# Patient Record
Sex: Female | Born: 1990 | Race: White | Hispanic: No | Marital: Married | Smoking: Current every day smoker
Health system: Southern US, Community
[De-identification: ages and names within clinical notes are randomized; demographics above are authoritative.]

## PROBLEM LIST (undated history)

## (undated) DIAGNOSIS — O24419 Gestational diabetes mellitus in pregnancy, unspecified control: Secondary | ICD-10-CM

## (undated) HISTORY — DX: Gestational diabetes mellitus in pregnancy, unspecified control: O24.419

## (undated) HISTORY — PX: FOOT SURGERY: SHX648

## (undated) HISTORY — PX: EUSTACHIAN TUBE DILATION: SHX6770

---
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
## 2008-03-11 ENCOUNTER — Ambulatory Visit: Payer: Self-pay | Admitting: Family Medicine

## 2008-03-11 DIAGNOSIS — Z91038 Other insect allergy status: Secondary | ICD-10-CM | POA: Insufficient documentation

## 2008-03-11 DIAGNOSIS — H6690 Otitis media, unspecified, unspecified ear: Secondary | ICD-10-CM | POA: Insufficient documentation

## 2008-03-27 ENCOUNTER — Other Ambulatory Visit: Admission: RE | Admit: 2008-03-27 | Discharge: 2008-03-27 | Payer: Self-pay | Admitting: Family Medicine

## 2008-03-27 ENCOUNTER — Encounter: Payer: Self-pay | Admitting: Family Medicine

## 2008-03-27 ENCOUNTER — Ambulatory Visit: Payer: Self-pay | Admitting: Family Medicine

## 2008-03-27 DIAGNOSIS — R61 Generalized hyperhidrosis: Secondary | ICD-10-CM | POA: Insufficient documentation

## 2008-03-27 LAB — CONVERTED CEMR LAB: Beta hcg, urine, semiquantitative: NEGATIVE

## 2008-12-02 ENCOUNTER — Observation Stay: Payer: Self-pay

## 2008-12-17 ENCOUNTER — Observation Stay: Payer: Self-pay

## 2008-12-28 ENCOUNTER — Inpatient Hospital Stay: Payer: Self-pay

## 2017-01-16 ENCOUNTER — Emergency Department
Admission: EM | Admit: 2017-01-16 | Discharge: 2017-01-16 | Disposition: A | Discharge status: Home / Self Care | Payer: Self-pay | Attending: Emergency Medicine | Admitting: Emergency Medicine

## 2017-01-16 ENCOUNTER — Encounter: Payer: Self-pay | Admitting: Emergency Medicine

## 2017-01-16 ENCOUNTER — Emergency Department: Payer: Self-pay

## 2017-01-16 DIAGNOSIS — J069 Acute upper respiratory infection, unspecified: Secondary | ICD-10-CM | POA: Insufficient documentation

## 2017-01-16 DIAGNOSIS — F17210 Nicotine dependence, cigarettes, uncomplicated: Secondary | ICD-10-CM | POA: Insufficient documentation

## 2017-01-16 DIAGNOSIS — H6981 Other specified disorders of Eustachian tube, right ear: Secondary | ICD-10-CM | POA: Insufficient documentation

## 2017-01-16 MED ORDER — AZITHROMYCIN 500 MG PO TABS
500.0000 mg | ORAL_TABLET | Freq: Once | ORAL | Status: AC
  Administered 2017-01-16: 500 mg via ORAL
  Filled 2017-01-16: qty 1

## 2017-01-16 MED ORDER — PREDNISONE 10 MG (21) PO TBPK: ORAL_TABLET | ORAL | 0 refills | Status: DC

## 2017-01-16 MED ORDER — AZITHROMYCIN 250 MG PO TABS: ORAL_TABLET | ORAL | 0 refills | Status: DC

## 2017-01-16 MED ORDER — PREDNISONE 20 MG PO TABS
60.0000 mg | ORAL_TABLET | Freq: Once | ORAL | Status: AC
  Administered 2017-01-16: 60 mg via ORAL
  Filled 2017-01-16: qty 3

## 2017-01-16 NOTE — ED Provider Notes (Signed)
[facility] Emergency Department Provider Note  ____________________________________________  Time seen: Approximately 9:32 PM  I have reviewed the triage vital signs and the nursing notes.   HISTORY  Chief Complaint Otalgia    HPI Kimberly Randall is a 26 y.o. female that presents to the emergency department with a clogged feeling in her right ear for 1 day. Patient states that she has also been congested and had a productive cough with white sputum for one week. She smokes a half a pack of cigarettes per day. 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 No alleviating measures have been attempted. She denies fever, shortness of breath, chest pain, nausea, vomiting, abdominal pain.   History reviewed. No pertinent past medical history.  Patient Active Problem List   Diagnosis Date Noted  . HYPERHIDROSIS 03/27/2008  . OTITIS MEDIA, RIGHT 03/11/2008  . 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 IRREGULAR MENSES 11/01/2007  . HIP PAIN, BILATERAL 11/01/2007  . SPRAIN/STRAIN, ANKLE NOS 04/06/2007  . MONONUCLEOSIS 04/05/2007  . SYMPTOM, PAIN, ABDOMINAL, LEFT LOWER QUADRANT 12/01/2006    Past Surgical History:  Procedure Laterality Date  . CESAREAN SECTION    . EUSTACHIAN TUBE DILATION    . FOOT SURGERY      Prior to Admission medications   Medication Sig Start Date End Date Taking? Authorizing Provider  azithromycin (ZITHROMAX Z-PAK) 250 MG tablet Take 2 tablets (500 mg) on  Day 1,  followed by 1 tablet (250 mg) once daily on Days 2 through 5. 01/16/17   Enid DerryWagner, Tung Pustejovsky, PA-C  predniSONE (STERAPRED UNI-PAK 21 TAB) 10 MG (21) TBPK tablet Take 6 tablets on day 1, take 5 tablets on day 2, take 4 tablets on day 3, take 3 tablets on day 4, take 2 tablets on day 5, take 1 tablet on day 6 01/16/17   Enid DerryWagner, Jachob Mcclean, PA-C    Allergies Patient has no known allergies.  No family history on file.  Social History Social History   Substance Use Topics  . Smoking status: Current Every Day Smoker    Packs/day: 0.50  . Smokeless tobacco: Never Used  . Alcohol use No     Review of Systems  Constitutional: No fever/chills Eyes: No visual changes. No discharge. ENT: Positive for congestion and rhinorrhea. Cardiovascular: No chest pain. Respiratory: Positive for cough. No SOB. Gastrointestinal: No abdominal pain.  No nausea, no vomiting.  No diarrhea.  No constipation. Musculoskeletal: Negative for musculoskeletal pain. Skin: Negative for rash, abrasions, lacerations, ecchymosis. Neurological: Negative for headaches.   ____________________________________________   PHYSICAL EXAM:  VITAL SIGNS: ED Triage Vitals [01/16/17 2110]  Enc Vitals Group     BP 129/83     Pulse Rate 65     Resp 18     Temp 98.3 F (36.8 C)     Temp Source Oral     SpO2 100 %     Weight 130 lb (59 kg)     Height 5\' 8"  (1.727 m)     Head Circumference      Peak Flow      Pain Score 10     Pain Loc      Pain Edu?      Excl. in GC?      Constitutional: Alert and oriented. Well appearing and in no acute distress. Eyes: Conjunctivae are normal. PERRL. EOMI. No discharge. Head: Atraumatic. ENT: No frontal and maxillary sinus tenderness.      Ears: Tympanic  membranes pearly gray with good landmarks. No discharge.      Nose: Mild congestion/rhinnorhea.      Mouth/Throat: Mucous membranes are moist. Oropharynx non-erythematous.  Neck: No stridor.   Hematological/Lymphatic/Immunilogical: No cervical lymphadenopathy. Cardiovascular: Normal rate, regular rhythm.  Good peripheral circulation. Respiratory: Normal respiratory effort without tachypnea or retractions. Lungs CTAB. Good air entry to the bases with no decreased or absent breath sounds. Gastrointestinal: Bowel sounds 4 quadrants. Soft and nontender to palpation. No guarding or rigidity. No palpable masses. No distention. Musculoskeletal: Full range of motion to all  extremities. No gross deformities appreciated. Neurologic:  Normal speech and language. No gross focal neurologic deficits are appreciated.  Skin:  Skin is warm, dry and intact. No rash noted.   ____________________________________________   LABS (all labs ordered are listed, but only abnormal results are displayed)  Labs Reviewed - No data to display ____________________________________________  EKG   ____________________________________________  RADIOLOGY  Dg Chest 2 View  Result Date: 01/16/2017 CLINICAL DATA:  26 year old female with dry cough. EXAM: CHEST  2 VIEW COMPARISON:  None. FINDINGS: The heart size and mediastinal contours are within normal limits. Both lungs are clear. The visualized skeletal structures are unremarkable. IMPRESSION: No active cardiopulmonary disease. Electronically Signed   By: Elgie Collard M.D.   On: 01/16/2017 21:49    ____________________________________________    PROCEDURES  Procedure(s) performed:    Procedures    Medications  predniSONE (DELTASONE) tablet 60 mg (60 mg Oral Given 01/16/17 2220)  azithromycin (ZITHROMAX) tablet 500 mg (500 mg Oral Given 01/16/17 2219)     ____________________________________________   INITIAL IMPRESSION / ASSESSMENT AND PLAN / ED COURSE  Pertinent labs & imaging results that were available during my care of the patient were reviewed by me and considered in my medical decision making (see chart for details).  Review of the [place] CSRS was performed in accordance of the [facility] prior to dispensing any controlled drugs.     Patient's diagnosis is consistent with upper respiratory infection and dysfunction of eustachian tube. Vital signs and exam are reassuring. Chest x-ray negative for acute cardiopulmonary processes. Patient feels comfortable going home. Patient will be discharged home with prescriptions for azithromycin and prednisone. Patient is to follow up with PCP as needed or otherwise  directed. Patient is given ED precautions to return to the ED for any worsening or new symptoms.     ____________________________________________  FINAL CLINICAL IMPRESSION(S) / ED DIAGNOSES  Final diagnoses:  Upper respiratory tract infection, unspecified type  Dysfunction of right eustachian tube      NEW MEDICATIONS STARTED DURING THIS VISIT:  Discharge Medication List as of 01/16/2017 10:12 PM    START taking these medications   Details  azithromycin (ZITHROMAX Z-PAK) 250 MG tablet Take 2 tablets (500 mg) on  Day 1,  followed by 1 tablet (250 mg) once daily on Days 2 through 5., Print    predniSONE (STERAPRED UNI-PAK 21 TAB) 10 MG (21) TBPK tablet Take 6 tablets on day 1, take 5 tablets on day 2, take 4 tablets on day 3, take 3 tablets on day 4, take 2 tablets on day 5, take 1 tablet on day 6, Print            This chart was dictated using voice recognition software/Dragon. Despite best efforts to proofread, errors can occur which can change the meaning. Any change was purely unintentional.    Enid Derry, PA-C 01/16/17 2349    Pershing Proud Myra Rude, MD 01/17/17  0108  

## 2017-01-16 NOTE — ED Triage Notes (Signed)
Pt states that she "started getting the clogged ear feeling last night" in her right ear. Pt reports hx of perforated ear drum in same ear x 2. Pt is ambulatory to triage with NAD noted at this time.

## 2017-03-17 ENCOUNTER — Other Ambulatory Visit: Payer: Self-pay | Admitting: Internal Medicine

## 2017-03-17 DIAGNOSIS — M5489 Other dorsalgia: Secondary | ICD-10-CM

## 2017-03-23 ENCOUNTER — Ambulatory Visit
Admission: RE | Admit: 2017-03-23 | Discharge: 2017-03-23 | Disposition: A | Discharge status: Home / Self Care | Payer: TRICARE For Life (TFL) | Source: Ambulatory Visit | Attending: Internal Medicine | Admitting: Internal Medicine

## 2017-03-23 DIAGNOSIS — M5489 Other dorsalgia: Secondary | ICD-10-CM | POA: Insufficient documentation

## 2017-03-23 MED ORDER — GADOBENATE DIMEGLUMINE 529 MG/ML IV SOLN
15.0000 mL | Freq: Once | INTRAVENOUS | Status: AC | PRN
  Administered 2017-03-23: 12 mL via INTRAVENOUS

## 2017-05-10 ENCOUNTER — Encounter: Payer: Self-pay | Admitting: Student in an Organized Health Care Education/Training Program

## 2017-05-10 ENCOUNTER — Ambulatory Visit
Payer: TRICARE For Life (TFL) | Attending: Student in an Organized Health Care Education/Training Program | Admitting: Student in an Organized Health Care Education/Training Program

## 2017-05-10 VITALS — BP 111/78 | HR 70 | Temp 98.6°F | Resp 16 | Ht 67.0 in | Wt 142.0 lb

## 2017-05-10 DIAGNOSIS — Z79891 Long term (current) use of opiate analgesic: Secondary | ICD-10-CM | POA: Diagnosis not present

## 2017-05-10 DIAGNOSIS — F17210 Nicotine dependence, cigarettes, uncomplicated: Secondary | ICD-10-CM | POA: Diagnosis not present

## 2017-05-10 DIAGNOSIS — M545 Low back pain: Secondary | ICD-10-CM | POA: Insufficient documentation

## 2017-05-10 DIAGNOSIS — Z9889 Other specified postprocedural states: Secondary | ICD-10-CM | POA: Diagnosis not present

## 2017-05-10 DIAGNOSIS — N926 Irregular menstruation, unspecified: Secondary | ICD-10-CM | POA: Diagnosis not present

## 2017-05-10 DIAGNOSIS — G894 Chronic pain syndrome: Secondary | ICD-10-CM | POA: Insufficient documentation

## 2017-05-10 DIAGNOSIS — M25551 Pain in right hip: Secondary | ICD-10-CM | POA: Diagnosis not present

## 2017-05-10 DIAGNOSIS — Z91040 Latex allergy status: Secondary | ICD-10-CM | POA: Insufficient documentation

## 2017-05-10 DIAGNOSIS — Z8632 Personal history of gestational diabetes: Secondary | ICD-10-CM | POA: Insufficient documentation

## 2017-05-10 DIAGNOSIS — Z79899 Other long term (current) drug therapy: Secondary | ICD-10-CM | POA: Insufficient documentation

## 2017-05-10 DIAGNOSIS — R1032 Left lower quadrant pain: Secondary | ICD-10-CM | POA: Insufficient documentation

## 2017-05-10 DIAGNOSIS — M79672 Pain in left foot: Secondary | ICD-10-CM | POA: Diagnosis present

## 2017-05-10 DIAGNOSIS — M25552 Pain in left hip: Secondary | ICD-10-CM | POA: Diagnosis not present

## 2017-05-10 DIAGNOSIS — G5772 Causalgia of left lower limb: Secondary | ICD-10-CM | POA: Insufficient documentation

## 2017-05-10 MED ORDER — NONFORMULARY OR COMPOUNDED ITEM: 2 refills | Status: AC

## 2017-05-10 MED ORDER — TIZANIDINE HCL 4 MG PO TABS: 4.0000 mg | ORAL_TABLET | Freq: Two times a day (BID) | ORAL | 1 refills | Status: AC | PRN

## 2017-05-10 MED ORDER — PREGABALIN 25 MG PO CAPS: 25.0000 mg | ORAL_CAPSULE | Freq: Two times a day (BID) | ORAL | 1 refills | Status: AC

## 2017-05-10 NOTE — Patient Instructions (Addendum)
As we discussed, your left foot pain may be secondary to complex regional pain syndrome. Please be sure to read about this.  For your pain we will start off with the following -Lyrica 25 mg twice daily (we are trying this medication since gabapentin caused sedation) -Tizanidine 4 mg twice daily as needed for muscle spasms -compounded cream that she can apply to your left ankle. This must be taken to a compounding pharmacy   Complex Regional Pain Syndrome Complex regional pain syndrome (CRPS) is a nerve disorder that causes long-lasting (chronic) pain, usually in a hand, arm, leg, or foot. CRPS usually follows an injury or trauma, such as a fracture or sprain. There are two types of CRPS:  Type 1. This type occurs after an injury or trauma with no known damage to a nerve.  Type 2. This type occurs after injury or trauma damages a nerve.  There are three stages of the condition:  Stage 1. This stage, called the acute stage, may last for three months.  Stage 2. This stage, called the dystrophic stage, may last for three to 12 months.  Stage 3. This stage, called the atrophic stage, may start after one year.  CRPS ranges from mild to severe. For most people CRPS is mild and recovery happens over time. For others, CRPS lasts a very long time and is debilitating. What are the causes? The exact cause of CRPS is not known. What increases the risk? You may be at increased risk if:  You are a woman.  You are approximately 26 years of age.  You have any of the following: ? A family history of CRPS. ? An injury or surgery. ? An infection. ? Cancer. ? Neck problems. ? A stroke. ? A heart attack. ? Asthma.  What are the signs or symptoms? Signs and symptoms in the affected limb are different for each stage. Signs and symptoms of stage 1 include:  Burning pain.  A pins and needles sensation.  Extremely sensitive skin.  Swelling.  Joint stiffness.  Warmth and  redness.  Excessive sweating.  Hair and nail growth that is faster than normal.  Signs and symptoms of stage 2 include:  Spreading of pain to the whole limb.  Increased skin sensitivity.  Increased swelling and stiffness.  Coolness of the skin.  Blue discoloration of skin.  Loss of skin wrinkles.  Brittle fingernails.  Signs and symptoms of stage 3 include:  Pain that spreads to other areas of the body but becomes less severe.  More stiffness, leading to loss of motion.  Skin that is pale, dry, shiny, and tightly stretched.  How is this diagnosed? There is no test to diagnose CRPS. Your health care provider will make a diagnosis based on your signs and symptoms and a physical exam. The exam may include tests to rule out other possible causes of your symptoms. Sometimes imaging tests are done, such as an MRI or bone scan. These tests check for bone changes that might indicate CRPS. How is this treated? Early treatment may prevent CRPS from advancing past stage 1. There is no one treatment that works for everyone. Treatment options may include:  Medicines, such as: ? Nonsteroidal-anti-inflammatory drugs (NSAIDS). ? Steroids. ? Blood pressure drugs. ? Antidepressants. ? Anti-seizure drugs. ? Pain relievers.  Exercise.  Occupational and physical therapy.  Biofeedback.  Mental health counseling.  Numbing injections.  Spinal surgery to implant a spinal cord stimulator or a pain pump.  Follow these instructions at home:  Take medicines only as directed by your health care provider.  Follow an exercise program as directed by your health care provider.  Maintain a healthy weight.  Keep all follow-up visits as directed by your health care provider. This is important. Contact a health care provider if:  Your symptoms change.  Your symptoms get worse.  You develop anxiety or depression. This information is not intended to replace advice given to you by your  health care provider. Make sure you discuss any questions you have with your health care provider. Document Released: 07/09/2002 Document Revised: 12/25/2015 Document Reviewed: 04/15/2014 Elsevier Interactive Patient Education  [employer].

## 2017-05-10 NOTE — Progress Notes (Signed)
Patient's Name: Kimberly Randall  MRN: [id number]  Referring Provider: Marinda Elk, MD  DOB: [birth year]  PCP: Marinda Elk, MD  DOS: 05/10/2017  Note by: Gillis Santa, MD  Service setting: Ambulatory outpatient  Specialty: Interventional Pain Management  Location: [facility] (AMB) Pain Management Facility  Visit type: Initial Patient Evaluation  Patient type: New Patient   Primary Reason(s) for Visit: Encounter for initial evaluation of one or more chronic problems (new to examiner) potentially causing chronic pain, and posing a threat to normal musculoskeletal function. (Level of risk: High) CC: Foot Pain (left) and Back Pain (mid, lower)  HPI  Kimberly Randall is a 26 y.o. year old, female patient, who comes today to see [place] for the first time for an initial evaluation of her chronic pain. She has MONONUCLEOSIS; OTITIS MEDIA, RIGHT; IRREGULAR MENSES; HIP PAIN, BILATERAL; HYPERHIDROSIS; SYMPTOM, PAIN, ABDOMINAL, LEFT LOWER QUADRANT; SPRAIN/STRAIN, ANKLE NOS; and PERSONAL HISTORY OF ALLERGY TO INSECTS on her problem list. Today she comes in for evaluation of her Foot Pain (left) and Back Pain (mid, lower)  Pain Assessment: Location: Left Foot Radiating: ankle Onset: More than a month ago Duration: Chronic pain Quality: Aching, Constant, Sharp Severity: 6 /10 (self-reported pain score)  Note: Reported level is compatible with observation.                   When using our objective Pain Scale, levels between 6 and 10/10 are said to belong in an emergency room, as it progressively worsens from a 6/10, described as severely limiting, requiring emergency care not usually available at an outpatient pain management facility. At a 6/10 level, communication becomes difficult and requires great effort. Assistance to reach the emergency department may be required. Facial flushing and profuse sweating along with potentially dangerous increases in heart rate and blood pressure will be evident. Effect on ADL:  prevent working, standing, walking  Timing: Constant Modifying factors: rest  Onset and Duration: Gradual and Present longer than 3 months Cause of pain: Trauma Severity: Getting worse, NAS-11 at its worse: 7/10, NAS-11 at its best: 4/10, NAS-11 now: 6/10 and NAS-11 on the average: 6/10 Timing: Not influenced by the time of the day, During activity or exercise and After activity or exercise Aggravating Factors: Kneeling, Prolonged standing, Squatting, Walking, Walking uphill and Walking downhill Alleviating Factors: Cold packs, Lying down, Medications, Resting and Using a brace Associated Problems: Fatigue, Numbness, Spasms, Swelling and Pain that wakes patient up Quality of Pain: Burning, Constant, Nagging, Sharp, Shooting, Tender, Throbbing and Uncomfortable Previous Examinations or Tests: MRI scan and X-rays Previous Treatments: Narcotic medications, Physical Therapy and Trigger point injections  The patient comes into the clinics today for the first time for a chronic pain management evaluation.   26 year old female who presents with left ankle and left foot pain. Patient states that left foot pain started after a accident that she sustained in high school. Patient subsequently went on to have a total of 5 ankle surgeries which included placement, removal, revision of ankle hardware back in [place]. 5 surgeries were done over the course of 5 years. These were done in [place]. Last surgery was last January.  Patient has severe pain her left foot with increased sensitivity, cool to touch, discoloration at times, and edema at times as well. Patient states that the pain from her foot stops at the medial malleolus. Over time because she has been guarding her left foot, she has been compensatingon her right foot and right hip which  over the last 2-3 years has resulted in bilateral hip pain.  Today I took the time to provide the patient with information regarding my pain practice. The patient was  informed that my practice is divided into two sections: an interventional pain management section, as well as a completely separate and distinct medication management section. I explained that I have procedure days for my interventional therapies, and evaluation days for follow-ups and medication management. Because of the amount of documentation required during both, they are kept separated. This means that there is the possibility that she may be scheduled for a procedure on one day, and medication management the next. I have also informed her that because of staffing and facility limitations, I no longer take patients for medication management only. To illustrate the reasons for this, I gave the patient the example of surgeons, and how inappropriate it would be to refer a patient to his/her care, just to write for the post-surgical antibiotics on a surgery done by a different surgeon.   Because interventional pain management is my board-certified specialty, the patient was informed that joining my practice means that they are open to any and all interventional therapies. I made it clear that this does not mean that they will be forced to have any procedures done. What this means is that I believe interventional therapies to be essential part of the diagnosis and proper management of chronic pain conditions. Therefore, patients not interested in these interventional alternatives will be better served under the care of a different practitioner.  The patient was also made aware of my Comprehensive Pain Management Safety Guidelines where by joining my practice, they limit all of their nerve blocks and joint injections to those done by our practice, for as long as we are retained to manage their care.   Historic Controlled Substance Pharmacotherapy Review  PMP and historical list of controlled substances: Norco 7.5 mg, quantity 10, last fill 05/04/2017 Highest opioid analgesic regimen found: Roxicodone 30 mg,  quantity 100, last fill 01/14/2017 Most recent opioid analgesic: Norco as above Current opioid analgesics: Norco as above  Highest recorded MME/day: 225 mg/day MME/day: 13m/day Medications: The patient did not bring the medication(s) to the appointment, as requested in our "New Patient Package" Pharmacodynamics: Desired effects: Analgesia: The patient reports >50% benefit. Reported improvement in function: The patient reports medication allows her to accomplish basic ADLs. Clinically meaningful improvement in function (CMIF): Sustained CMIF goals met Perceived effectiveness: Described as relatively effective, allowing for increase in activities of daily living (ADL) Undesirable effects: Side-effects or Adverse reactions: None reported Historical Monitoring: The patient  reports that she does not use drugs. List of all UDS Test(s): No results found for: MDMA, COCAINSCRNUR, PCPSCRNUR, PCPQUANT, CANNABQUANT, THCU, ESt. [place] SouthList of all Serum Drug Screening Test(s):  No results found for: AMPHSCRSER, BARBSCRSER, BENZOSCRSER, COCAINSCRSER, PCPSCRSER, PCPQUANT, THCSCRSER, CANNABQUANT, OPIATESCRSER, OXYSCRSER, PROPOXSCRSER Historical Background Evaluation: [place] PMP: Six (6) year initial data search conducted.             PMP NARX Score Report:  Narcotic: 390 Sedative: 271 Stimulant: 0 [place] Department of public safety, offender search: (Editor, commissioningInformation) Non-contributory Risk Assessment Profile: Aberrant behavior: None observed or detected today Risk factors for fatal opioid overdose: None identified today PMP NARX Overdose Risk Score: 440 Fatal overdose hazard ratio (HR): Calculation deferred Non-fatal overdose hazard ratio (HR): Calculation deferred Risk of opioid abuse or dependence: 0.7-3.0% with doses ? 36 MME/day and 6.1-26% with doses ? 120 MME/day. Substance use disorder (SUD)  risk level: goal is to avoid opioid therapy. Opioid risk tool (ORT) (Total Score): 0     Opioid Risk Tool -  05/10/17 0928      Family History of Substance Abuse   Alcohol Negative   Illegal Drugs Negative   Rx Drugs Negative     Personal History of Substance Abuse   Alcohol Negative   Illegal Drugs Negative   Rx Drugs Negative     Age   Age between 37-45 years  No     Psychological Disease   Psychological Disease Negative   Depression Negative     Total Score   Opioid Risk Tool Scoring 0   Opioid Risk Interpretation Low Risk     ORT Scoring interpretation table:  Score <3 = Low Risk for SUD  Score between 4-7 = Moderate Risk for SUD  Score >8 = High Risk for Opioid Abuse     Pharmacologic Plan: Pending ordered tests and/or consults            Initial impression: Poor candidate for opioid analgesics.  Meds   Current Outpatient Prescriptions:  .  azithromycin (ZITHROMAX Z-PAK) 250 MG tablet, Take 2 tablets (500 mg) on  Day 1,  followed by 1 tablet (250 mg) once daily on Days 2 through 5., Disp: 6 each, Rfl: 0 .  ibuprofen (ADVIL,MOTRIN) 800 MG tablet, Take 800 mg by mouth every 8 (eight) hours as needed., Disp: , Rfl:  .  zolpidem (AMBIEN) 5 MG tablet, Take 5 mg by mouth at bedtime., Disp: , Rfl:  .  NONFORMULARY OR COMPOUNDED ITEM, 10% Ketamine/2% Cyclobenzaprine/6% Gabapentin Cream Sig: 1-2 ml to affected area 3-4 times/day. Dispense: 240 GM bottle, Disp: 1 each, Rfl: 2 .  predniSONE (STERAPRED UNI-PAK 21 TAB) 10 MG (21) TBPK tablet, Take 6 tablets on day 1, take 5 tablets on day 2, take 4 tablets on day 3, take 3 tablets on day 4, take 2 tablets on day 5, take 1 tablet on day 6 (Patient not taking: Reported on 05/10/2017), Disp: 21 tablet, Rfl: 0 .  pregabalin (LYRICA) 25 MG capsule, Take 1 capsule (25 mg total) by mouth 2 (two) times daily., Disp: 60 capsule, Rfl: 1 .  tiZANidine (ZANAFLEX) 4 MG tablet, Take 1 tablet (4 mg total) by mouth 2 (two) times daily as needed for muscle spasms., Disp: 60 tablet, Rfl: 1  Imaging Review   Knee-L DG 1-2 views:  Results for orders  placed in visit on 07/25/06  DG Knee 1-2 Views Left   Narrative   PRIOR REPORT IMPORTED FROM THE [employer] WORKFLOW SYSTEM   REASON FOR EXAM:    Injury  COMMENTS:   PROCEDURE:     DXR - DXR KNEE LEFT AP AND LATERAL  - Jul 25 2006  2:34AM   RESULT:     AP and lateral views of the LEFT knee show no fracture,  dislocation or other acute bony abnormality. The knee joint space is well  maintained. The patella is intact.   IMPRESSION:     No acute changes are identified.   Thank you for this opportunity to contribute to the care of your patient.        Complexity Note: Imaging results reviewed. Results shared with Ms. Barraco, using Layman's terms.                         ROS  Cardiovascular History: No reported cardiovascular signs or symptoms such as High  blood pressure, coronary artery disease, abnormal heart rate or rhythm, heart attack, blood thinner therapy or heart weakness and/or failure Pulmonary or Respiratory History: Smoking Neurological History: No reported neurological signs or symptoms such as seizures, abnormal skin sensations, urinary and/or fecal incontinence, being born with an abnormal open spine and/or a tethered spinal cord Review of Past Neurological Studies: No results found for this or any previous visit. Psychological-Psychiatric History: Difficulty sleeping and or falling asleep Gastrointestinal History: No reported gastrointestinal signs or symptoms such as vomiting or evacuating blood, reflux, heartburn, alternating episodes of diarrhea and constipation, inflamed or scarred liver, or pancreas or irrregular and/or infrequent bowel movements Genitourinary History: No reported renal or genitourinary signs or symptoms such as difficulty voiding or producing urine, peeing blood, non-functioning kidney, kidney stones, difficulty emptying the bladder, difficulty controlling the flow of urine, or chronic kidney disease Hematological History: Brusing easily Endocrine  History: No reported endocrine signs or symptoms such as high or low blood sugar, rapid heart rate due to high thyroid levels, obesity or weight gain due to slow thyroid or thyroid disease Rheumatologic History: No reported rheumatological signs and symptoms such as fatigue, joint pain, tenderness, swelling, redness, heat, stiffness, decreased range of motion, with or without associated rash Musculoskeletal History: Negative for myasthenia gravis, muscular dystrophy, multiple sclerosis or malignant hyperthermia Work History: Out of work due to pain  Allergies  Ms. Grinnell is allergic to latex.  Laboratory Chemistry  Inflammation Markers (CRP: Acute Phase) (ESR: Chronic Phase) No results found for: CRP, ESRSEDRATE               Renal Function Markers No results found for: BUN, CREATININE, GFRAA, GFRNONAA               Hepatic Function Markers No results found for: AST, ALT, ALBUMIN, ALKPHOS, HCVAB               Electrolytes No results found for: NA, K, CL, CALCIUM, MG               Neuropathy Markers No results found for: [id number]               Bone Pathology Markers No results found for: Hendricks Milo, VD125OH2TOT, [id number], PO2423NT6, 25OHVITD1, 25OHVITD2, 25OHVITD3, CALCIUM, TESTOFREE, TESTOSTERONE               Coagulation Parameters No results found for: INR, LABPROT, APTT, PLT               Cardiovascular Markers No results found for: BNP, HGB, HCT               Note: Lab results reviewed.  [place]  Drug: Ms. Ritacco  reports that she does not use drugs. Alcohol:  reports that she does not drink alcohol. Tobacco:  reports that she has been smoking.  She has been smoking about 0.50 packs per day. She has never used smokeless tobacco. Medical:  has a past medical history of Gestational diabetes and Vaginal delivery. Family: family history is not on file.  Past Surgical History:  Procedure Laterality Date  . CESAREAN SECTION    . EUSTACHIAN TUBE DILATION    . FOOT  SURGERY     Active Ambulatory Problems    Diagnosis Date Noted  . MONONUCLEOSIS 04/05/2007  . OTITIS MEDIA, RIGHT 03/11/2008  . IRREGULAR MENSES 11/01/2007  . HIP PAIN, BILATERAL 11/01/2007  . HYPERHIDROSIS 03/27/2008  . SYMPTOM, PAIN, ABDOMINAL, LEFT LOWER QUADRANT 12/01/2006  . SPRAIN/STRAIN, ANKLE NOS  04/06/2007  . PERSONAL HISTORY OF ALLERGY TO INSECTS 03/11/2008   Resolved Ambulatory Problems    Diagnosis Date Noted  . No Resolved Ambulatory Problems   Past Medical History:  Diagnosis Date  . Gestational diabetes   . Vaginal delivery    Constitutional Exam  General appearance: Well nourished, well developed, and well hydrated. In no apparent acute distress Vitals:   05/10/17 0917  BP: 111/78  Pulse: 70  Resp: 16  Temp: 98.6 F (37 C)  TempSrc: Oral  SpO2: 100%  Weight: 142 lb (64.4 kg)  Height: 5' 7"  (1.702 m)   BMI Assessment: Estimated body mass index is 22.24 kg/m as calculated from the following:   Height as of this encounter: 5' 7"  (1.702 m).   Weight as of this encounter: 142 lb (64.4 kg).  BMI interpretation table: BMI level Category Range association with higher incidence of chronic pain  <18 kg/m2 Underweight   18.5-24.9 kg/m2 Ideal body weight   25-29.9 kg/m2 Overweight Increased incidence by 20%  30-34.9 kg/m2 Obese (Class I) Increased incidence by 68%  35-39.9 kg/m2 Severe obesity (Class II) Increased incidence by 136%  >40 kg/m2 Extreme obesity (Class III) Increased incidence by 254%   BMI Readings from Last 4 Encounters:  05/10/17 22.24 kg/m  01/16/17 19.77 kg/m  12/01/06 22.40 kg/m (64 %, Z= 0.37)*   * Growth percentiles are based on CDC 2-20 Years data.   Wt Readings from Last 4 Encounters:  05/10/17 142 lb (64.4 kg)  01/16/17 130 lb (59 kg)  03/27/08 155 lb (70.3 kg) (86 %, Z= 1.06)*  03/11/08 152 lb (68.9 kg) (84 %, Z= 0.98)*   * Growth percentiles are based on CDC 2-20 Years data.  Psych/Mental status: Alert, oriented x 3  (person, place, & time)       Eyes: PERLA Respiratory: No evidence of acute respiratory distress  Cervical Spine Area Exam  Skin & Axial Inspection: No masses, redness, edema, swelling, or associated skin lesions Alignment: Symmetrical Functional ROM: Unrestricted ROM      Stability: No instability detected Muscle Tone/Strength: Functionally intact. No obvious neuro-muscular anomalies detected. Sensory (Neurological): Unimpaired Palpation: No palpable anomalies              Upper Extremity (UE) Exam    Side: Right upper extremity  Side: Left upper extremity  Skin & Extremity Inspection: Skin color, temperature, and hair growth are WNL. No peripheral edema or cyanosis. No masses, redness, swelling, asymmetry, or associated skin lesions. No contractures.  Skin & Extremity Inspection: Skin color, temperature, and hair growth are WNL. No peripheral edema or cyanosis. No masses, redness, swelling, asymmetry, or associated skin lesions. No contractures.  Functional ROM: Unrestricted ROM          Functional ROM: Unrestricted ROM          Muscle Tone/Strength: Functionally intact. No obvious neuro-muscular anomalies detected.  Muscle Tone/Strength: Functionally intact. No obvious neuro-muscular anomalies detected.  Sensory (Neurological): Unimpaired          Sensory (Neurological): Unimpaired          Palpation: No palpable anomalies              Palpation: No palpable anomalies              Specialized Test(s): Deferred         Specialized Test(s): Deferred          Thoracic Spine Area Exam  Skin & Axial Inspection: No masses, redness, or  swelling Alignment: Symmetrical Functional ROM: Unrestricted ROM Stability: No instability detected Muscle Tone/Strength: Functionally intact. No obvious neuro-muscular anomalies detected. Sensory (Neurological): Unimpaired Muscle strength & Tone: No palpable anomalies  Lumbar Spine Area Exam  Skin & Axial Inspection: No masses, redness, or  swelling Alignment: Symmetrical Functional ROM: Unrestricted ROM      Stability: No instability detected Muscle Tone/Strength: Functionally intact. No obvious neuro-muscular anomalies detected. Sensory (Neurological): Unimpaired Palpation: No palpable anomalies       Provocative Tests: Lumbar Hyperextension and rotation test: evaluation deferred today       Lumbar Lateral bending test: evaluation deferred today       Patrick's Maneuver: evaluation deferred today                    Gait & Posture Assessment  Ambulation: Unassisted Gait: Relatively normal for age and body habitus Posture: WNL   Lower Extremity Exam    Side: Right lower extremity  Side: Left lower extremity  Skin & Extremity Inspection: Skin color, temperature, and hair growth are WNL. No peripheral edema or cyanosis. No masses, redness, swelling, asymmetry, or associated skin lesions. No contractures.  Skin & Extremity Inspection: left ankle positive for discoloration. Cold to touch. No edema appreciated.  Functional ROM: Unrestricted ROM          Functional ROM: moderately limited plantar flexion dorsiflexion, severely limited eversion and inversion.          Muscle Tone/Strength: Functionally intact. No obvious neuro-muscular anomalies detected.  Muscle Tone/Strength: Guardingof left ankle  Sensory (Neurological): Unimpaired  Sensory (Neurological): Allodynia (Painful response to non-painful stimuli)  Palpation: No palpable anomalies  Palpation: Complains of area being tender to palpationaround ankle specifically medial malleolus.   Assessment  Primary Diagnosis & Pertinent Problem List: The primary encounter diagnosis was Complex regional pain syndrome type 2 of left lower extremity. Diagnoses of Chronic pain syndrome, Pain in joint involving left pelvic region and thigh, and History of ankle surgery were also pertinent to this visit.  Visit Diagnosis (New problems to examiner): 1. Complex regional pain syndrome type 2  of left lower extremity   2. Chronic pain syndrome   3. Pain in joint involving left pelvic region and thigh   4. History of ankle surgery    Plan of Care (Initial workup plan)  Note: Please be advised that as per protocol, today's visit has been an evaluation only. We have not taken over the patient's controlled substance management.  25 year old female who presents with left ankle pain status post 6 ankle surgeries after she sustained a accident in high school. Patient recently moved from [place]. Was under the care of her podiatrist in [place]. On clinical evaluation and exam, patient's left ankle and foot pain could be secondary to CRPS type 2. According to the [place] criteria for CRPS atient does have allodynia and hyperesthesia, has skin color changes and temperature discrepancy between left foot and right foot, swelling at times, and changes in hair growth along her left foot. For the patient's CRPS type II we discussed starting off with conservative measures such as neuropathic agents and muscle relaxants along with compounded topical cream with ketamine that could help with her sympathetically mediated pain.  Given the patient's age and ineffectiveness of opioid therapy in the past, my preference is to avoid opioid therapy in this patient until last resort. Her treatment plan includes titration of neuropathic agents as well as topical therapy with compounded creams. I will see the patient one  month. If she is not obtaining benefit, we discussed performing left lumbar sympathetic nerve block for CRPS of left lower extremity.  Plan: -start Lyrica 25 mg twice a day (gabapentin caused sedation in the past and patient did not tolerate) -Tizanidine 4 mg twice a day when necessary for back and leg spasms. -prescription for compounded cream that includes ketamine, Flexeril, gabapentin -Follow up in 1 month, we'll further discuss left lumbar L3 sympathetic nerve block at that  time.    Pharmacotherapy (current): Medications ordered:  Meds ordered this encounter  Medications  . pregabalin (LYRICA) 25 MG capsule    Sig: Take 1 capsule (25 mg total) by mouth 2 (two) times daily.    Dispense:  60 capsule    Refill:  1    Patient has tried Gabapentin and resulted in sedation and nausea  . tiZANidine (ZANAFLEX) 4 MG tablet    Sig: Take 1 tablet (4 mg total) by mouth 2 (two) times daily as needed for muscle spasms.    Dispense:  60 tablet    Refill:  1  . NONFORMULARY OR COMPOUNDED ITEM    Sig: 10% Ketamine/2% Cyclobenzaprine/6% Gabapentin Cream Sig: 1-2 ml to affected area 3-4 times/day. Dispense: 240 GM bottle    Dispense:  1 each    Refill:  2    Do not place this medication, or any other prescription from our practice, on "Automatic Refill". Patient may have prescription filled one day early if pharmacy is closed on scheduled refill date.   Medications administered during this visit: Ms. Vinas had no medications administered during this visit.   Pharmacological management options:  Opioid Analgesics: we'll avoid opioid therapy. If all other measures fail, patient will need pain psych referral prior to any consideration of opioid therapy.  Membrane stabilizer: gabapentin: Sedation did not tolerate. We'll trial Lyrica today. Consider Topamax in the future.  Muscle relaxant: tizanidine today  NSAID: To be determined at a later time  Other analgesic(s): To be determined at a later time   Interventional management options: Ms. Saar was informed that there is no guarantee that she would be a candidate for interventional therapies. The decision will be based on the results of diagnostic studies, as well as Ms. Wildermuth risk profile.  Procedure(s) under consideration:  -left lumbar sympathetic nerve block (superior and anterolateral border of left L2 vertebrae superior to L2 TP)   Provider-requested follow-up: No Follow-up on file.  Future Appointments Date  Time Provider [place]  06/14/2017 8:45 AM Gillis Santa, MD [facility] None    Primary Care Physician: Marinda Elk, MD Location: [facility] Note by: Gillis Santa, M.D, Date: 05/10/2017; Time: 1:51 PM  Patient Instructions  As we discussed, your left foot pain may be secondary to complex regional pain syndrome. Please be sure to read about this.  For your pain we will start off with the following -Lyrica 25 mg twice daily (we are trying this medication since gabapentin caused sedation) -Tizanidine 4 mg twice daily as needed for muscle spasms -compounded cream that she can apply to your left ankle. This must be taken to a compounding pharmacy   Complex Regional Pain Syndrome Complex regional pain syndrome (CRPS) is a nerve disorder that causes long-lasting (chronic) pain, usually in a hand, arm, leg, or foot. CRPS usually follows an injury or trauma, such as a fracture or sprain. There are two types of CRPS:  Type 1. This type occurs after an injury or trauma with no  known damage to a nerve.  Type 2. This type occurs after injury or trauma damages a nerve.  There are three stages of the condition:  Stage 1. This stage, called the acute stage, may last for three months.  Stage 2. This stage, called the dystrophic stage, may last for three to 12 months.  Stage 3. This stage, called the atrophic stage, may start after one year.  CRPS ranges from mild to severe. For most people CRPS is mild and recovery happens over time. For others, CRPS lasts a very long time and is debilitating. What are the causes? The exact cause of CRPS is not known. What increases the risk? You may be at increased risk if:  You are a woman.  You are approximately 28 years of age.  You have any of the following: ? A family history of CRPS. ? An injury or surgery. ? An infection. ? Cancer. ? Neck problems. ? A stroke. ? A heart attack. ? Asthma.  What  are the signs or symptoms? Signs and symptoms in the affected limb are different for each stage. Signs and symptoms of stage 1 include:  Burning pain.  A pins and needles sensation.  Extremely sensitive skin.  Swelling.  Joint stiffness.  Warmth and redness.  Excessive sweating.  Hair and nail growth that is faster than normal.  Signs and symptoms of stage 2 include:  Spreading of pain to the whole limb.  Increased skin sensitivity.  Increased swelling and stiffness.  Coolness of the skin.  Blue discoloration of skin.  Loss of skin wrinkles.  Brittle fingernails.  Signs and symptoms of stage 3 include:  Pain that spreads to other areas of the body but becomes less severe.  More stiffness, leading to loss of motion.  Skin that is pale, dry, shiny, and tightly stretched.  How is this diagnosed? There is no test to diagnose CRPS. Your health care provider will make a diagnosis based on your signs and symptoms and a physical exam. The exam may include tests to rule out other possible causes of your symptoms. Sometimes imaging tests are done, such as an MRI or bone scan. These tests check for bone changes that might indicate CRPS. How is this treated? Early treatment may prevent CRPS from advancing past stage 1. There is no one treatment that works for everyone. Treatment options may include:  Medicines, such as: ? Nonsteroidal-anti-inflammatory drugs (NSAIDS). ? Steroids. ? Blood pressure drugs. ? Antidepressants. ? Anti-seizure drugs. ? Pain relievers.  Exercise.  Occupational and physical therapy.  Biofeedback.  Mental health counseling.  Numbing injections.  Spinal surgery to implant a spinal cord stimulator or a pain pump.  Follow these instructions at home:  Take medicines only as directed by your health care provider.  Follow an exercise program as directed by your health care provider.  Maintain a healthy weight.  Keep all follow-up  visits as directed by your health care provider. This is important. Contact a health care provider if:  Your symptoms change.  Your symptoms get worse.  You develop anxiety or depression. This information is not intended to replace advice given to you by your health care provider. Make sure you discuss any questions you have with your health care provider. Document Released: 07/09/2002 Document Revised: 12/25/2015 Document Reviewed: 04/15/2014 Elsevier Interactive Patient Education  [employer].

## 2017-05-10 NOTE — Progress Notes (Signed)
Safety precautions to be maintained throughout the outpatient stay will include: orient to surroundings, keep bed in low position, maintain call bell within reach at all times, provide assistance with transfer out of bed and ambulation.  

## 2017-06-14 ENCOUNTER — Ambulatory Visit
Payer: TRICARE For Life (TFL) | Attending: Student in an Organized Health Care Education/Training Program | Admitting: Student in an Organized Health Care Education/Training Program

## 2018-05-16 IMAGING — MR MR PELVIS WO/W CM
5 of 8 series · 27 of 48 positions shown · IV contrast (multihance)
Comparison: None.

CLINICAL DATA: Chronic inflammatory low back pain. MOY BENOIT 27 gene
positive. Numbness in both hips.

EXAM:
MRI PELVIS WITHOUT AND WITH CONTRAST
TECHNIQUE: Multiplanar multisequence MR imaging of the pelvis was performed
both before and after administration of intravenous contrast.
Specific scans of the sacroiliac joints were performed.
CONTRAST:  12mL MULTIHANCE GADOBENATE DIMEGLUMINE 529 MG/ML IV SOLN

[Series 3: T1 · axial · 5.0mm · 1.02mm/px · z∈[-87,+9]mm · 5 of 19 slices shown (1 of 3)]
[im 1/19]
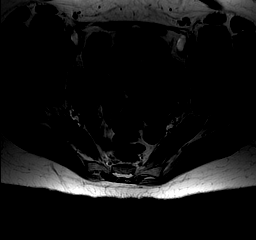
[im 5/19]
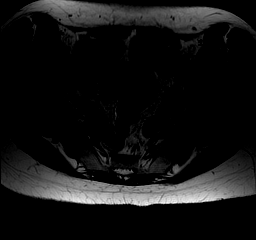
[im 10/19]
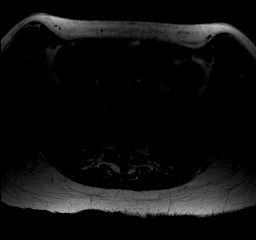
[im 14/19]
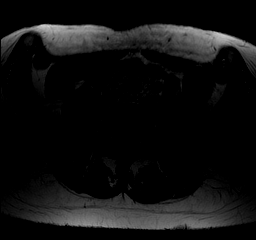
[im 19/19]
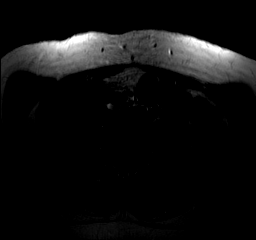

[Series 4: T2 fat-sat · axial · 5.0mm · 0.51mm/px · z∈[-91,+5]mm · 5 of 19 slices shown]
[im 1/19]
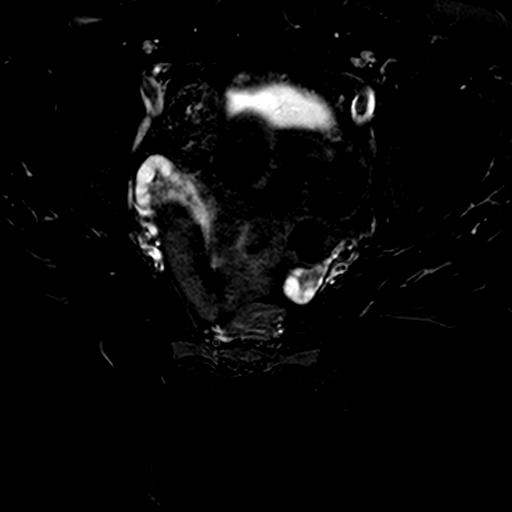
[im 5/19]
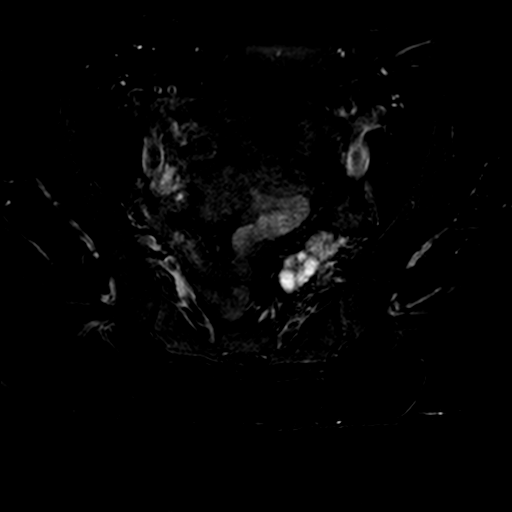
[im 10/19]
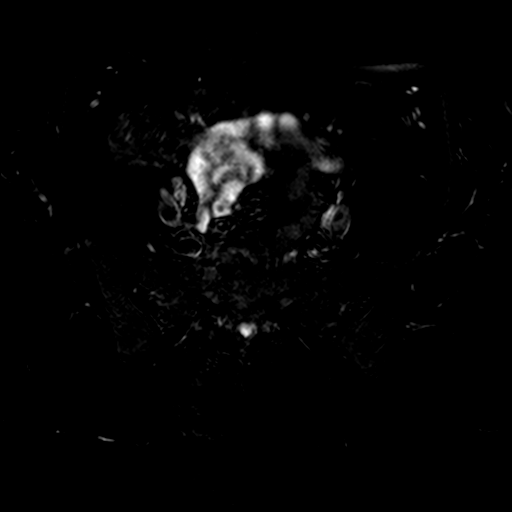
[im 14/19]
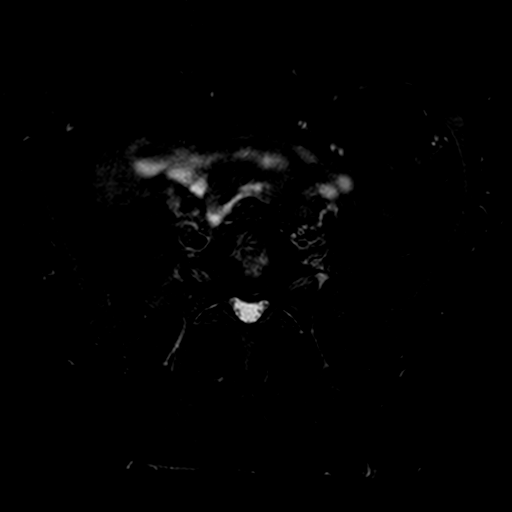
[im 19/19]
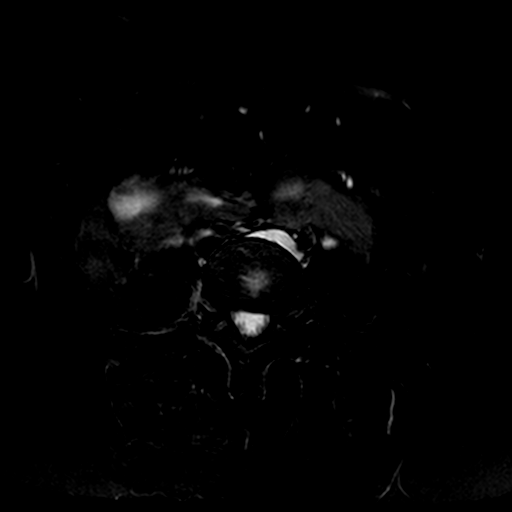

[Series 5: T1 · sagittal · 5.0mm · 0.49mm/px · 8 of 26 slices shown (2 of 3)]
[im 1/26]
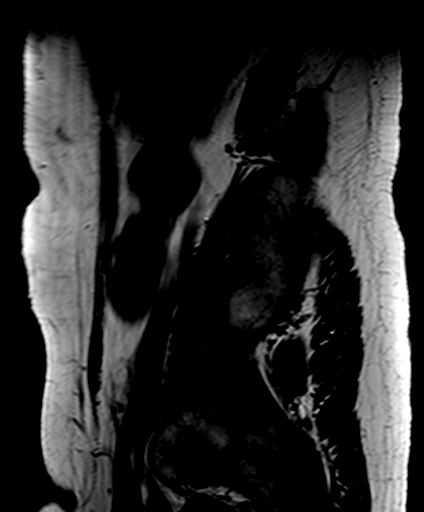
[im 4/26]
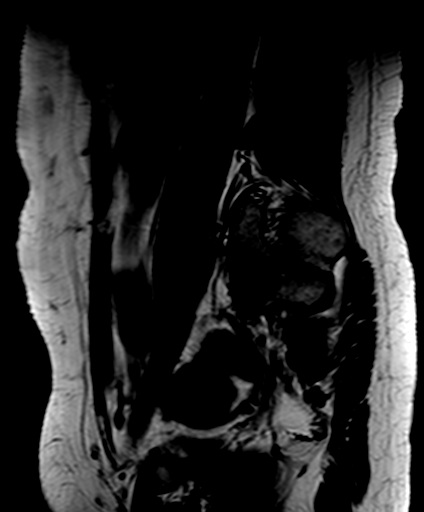
[im 8/26]
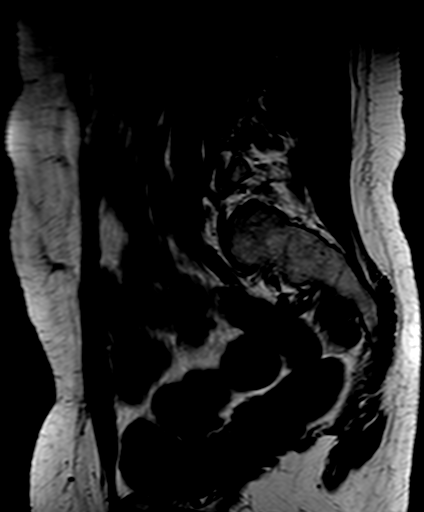
[im 11/26]
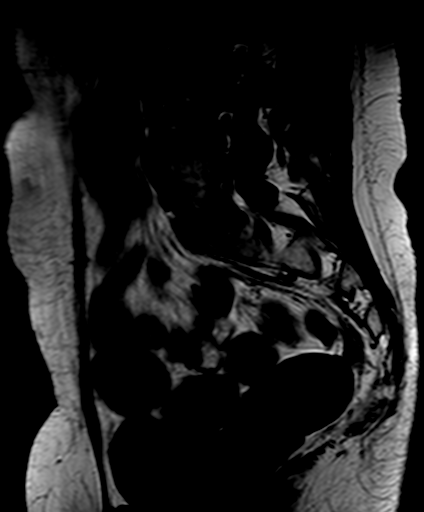
[im 15/26]
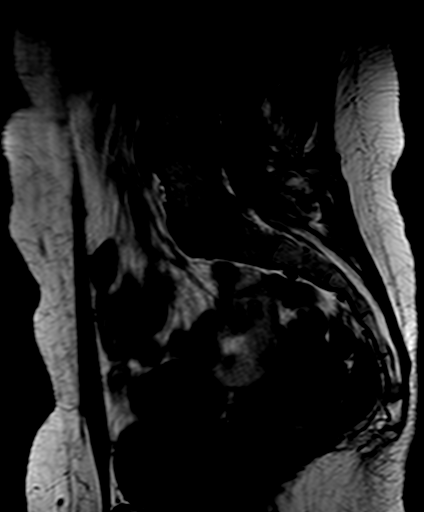
[im 18/26]
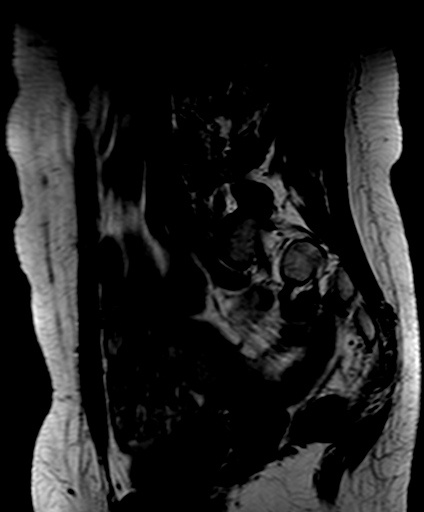
[im 22/26]
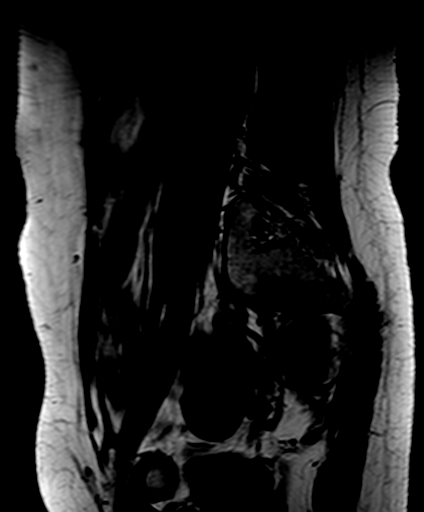
[im 26/26]
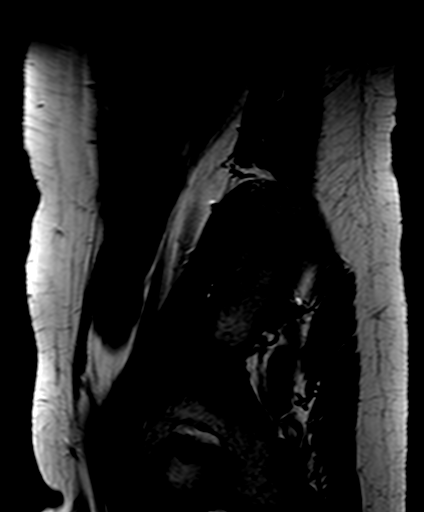

[Series 6: T1 · oblique · 4.0mm · 0.55mm/px · 5 of 15 slices shown (3 of 3)]
[im 1/15]
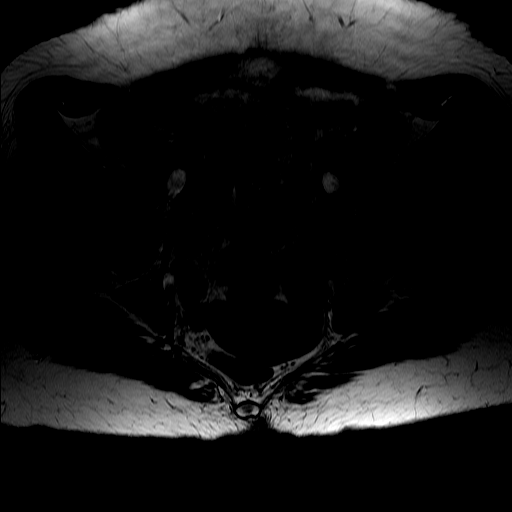
[im 4/15]
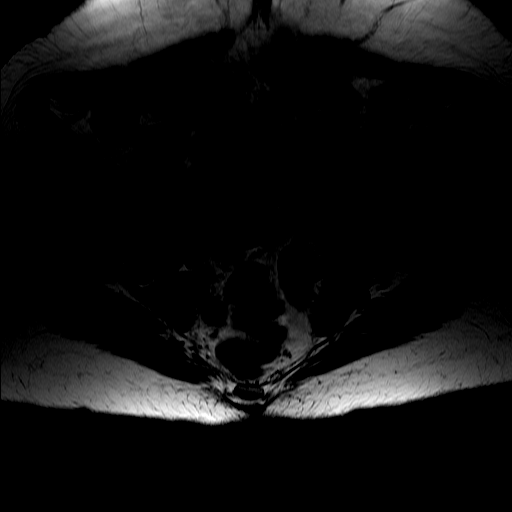
[im 8/15]
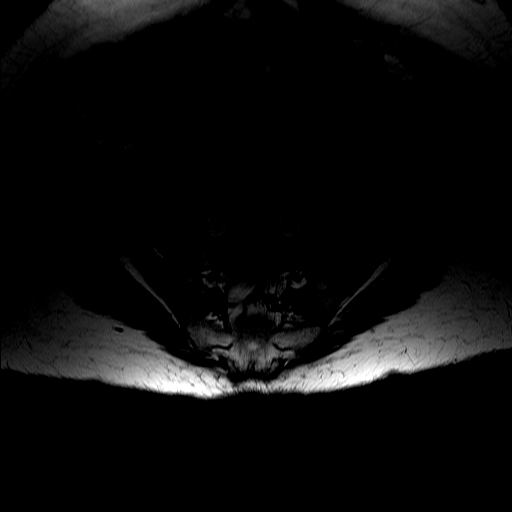
[im 11/15]
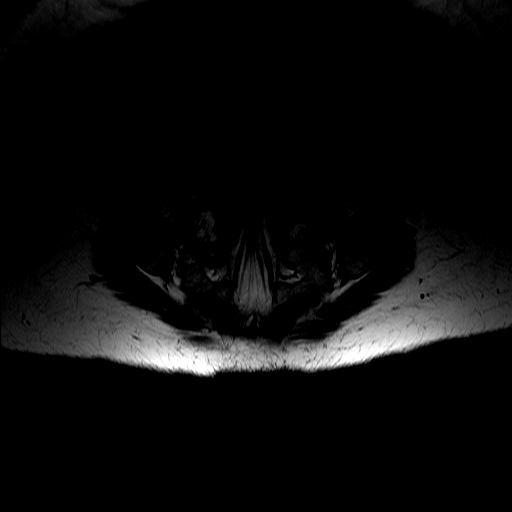
[im 15/15]
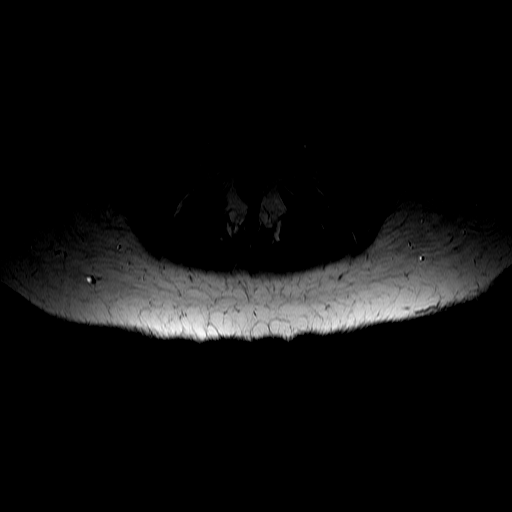

[Series 7: STIR · oblique · 4.0mm · 1.09mm/px · 4 of 15 slices shown]
[im 1/15]
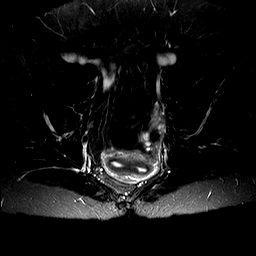
[im 4/15]
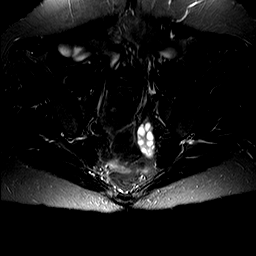
[im 8/15]
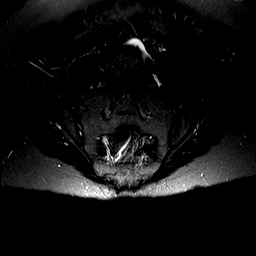
[im 11/15]
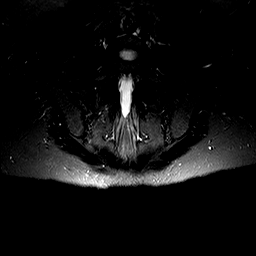

[27 of 48 positions shown; findings below may reference images not displayed]

FINDINGS: Musculoskeletal: The sacroiliac joints are well visualized appear
normal. There is no edema or abnormal enhancement of the sacroiliac
joints after contrast administration.

Urinary Tract:  Normal.

Bowel:  Normal.

Vascular/Lymphatic: Normal.

Reproductive: Numerous tiny follicles on the periphery of each
ovary. Does the patient have a history of polycystic ovary disease?

Other:  None
IMPRESSION: 1. Normal sacroiliac joints.  No evidence of sacroiliitis.
2. Multiple peripheral follicles on each ovary. This is nonspecific
but can be seen with polycystic ovary disease.

## 2020-01-26 ENCOUNTER — Emergency Department
Admission: EM | Admit: 2020-01-26 | Discharge: 2020-01-26 | Disposition: A | Discharge status: Home / Self Care | Attending: Emergency Medicine | Admitting: Emergency Medicine

## 2020-01-26 ENCOUNTER — Emergency Department

## 2020-01-26 ENCOUNTER — Other Ambulatory Visit: Payer: Self-pay

## 2020-01-26 ENCOUNTER — Encounter: Payer: Self-pay | Admitting: Radiology

## 2020-01-26 DIAGNOSIS — Y999 Unspecified external cause status: Secondary | ICD-10-CM | POA: Insufficient documentation

## 2020-01-26 DIAGNOSIS — Y9344 Activity, trampolining: Secondary | ICD-10-CM | POA: Insufficient documentation

## 2020-01-26 DIAGNOSIS — Y92838 Other recreation area as the place of occurrence of the external cause: Secondary | ICD-10-CM | POA: Insufficient documentation

## 2020-01-26 DIAGNOSIS — S93401A Sprain of unspecified ligament of right ankle, initial encounter: Secondary | ICD-10-CM | POA: Insufficient documentation

## 2020-01-26 DIAGNOSIS — Z91040 Latex allergy status: Secondary | ICD-10-CM | POA: Diagnosis not present

## 2020-01-26 DIAGNOSIS — F17200 Nicotine dependence, unspecified, uncomplicated: Secondary | ICD-10-CM | POA: Insufficient documentation

## 2020-01-26 DIAGNOSIS — X501XXA Overexertion from prolonged static or awkward postures, initial encounter: Secondary | ICD-10-CM | POA: Insufficient documentation

## 2020-01-26 DIAGNOSIS — S99911A Unspecified injury of right ankle, initial encounter: Secondary | ICD-10-CM | POA: Diagnosis present

## 2020-01-26 NOTE — ED Triage Notes (Signed)
Patient reports she was at trampoline park with family and was coming out and stepped on the step and ankle rolled and popped.  Right ankle pain since.

## 2020-01-26 NOTE — ED Provider Notes (Signed)
[facility] Emergency Department Provider Note  ____________________________________________   First MD Initiated Contact with Patient 01/26/20 2103     (approximate)  I have reviewed the triage vital signs and the nursing notes.   HISTORY  Chief Complaint Ankle Pain    HPI Kimberly Randall is a 29 y.o. female presents emergency department after hurting her right ankle/foot earlier today.  She was at a trampoline park and stepped on the foam-like step to go up to a different level which caused her to twist her ankle.  She denies any numbness or tingling.  States it hurts from the outside of the ankle to her foot.  No other injuries reported.  Pain is rated at  5/10.   Past Medical History:  Diagnosis Date  . Gestational diabetes   . Vaginal delivery     Patient Active Problem List   Diagnosis Date Noted  . HYPERHIDROSIS 03/27/2008  . OTITIS MEDIA, RIGHT 03/11/2008  . 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 IRREGULAR MENSES 11/01/2007  . HIP PAIN, BILATERAL 11/01/2007  . SPRAIN/STRAIN, ANKLE NOS 04/06/2007  . MONONUCLEOSIS 04/05/2007  . SYMPTOM, PAIN, ABDOMINAL, LEFT LOWER QUADRANT 12/01/2006    Past Surgical History:  Procedure Laterality Date  . CESAREAN SECTION    . EUSTACHIAN TUBE DILATION    . FOOT SURGERY      Prior to Admission medications   Medication Sig Start Date End Date Taking? Authorizing Provider  ibuprofen (ADVIL,MOTRIN) 800 MG tablet Take 800 mg by mouth every 8 (eight) hours as needed.    [provider]  pregabalin (LYRICA) 25 MG capsule Take 1 capsule (25 mg total) by mouth 2 (two) times daily. 05/10/17   Gillis Santa, MD  zolpidem (AMBIEN) 5 MG tablet Take 5 mg by mouth at bedtime.    [provider]    Allergies Latex  No family history on file.  Social History Social History   Tobacco Use  . Smoking status: Current Every Day Smoker    Packs/day: 0.50  . Smokeless tobacco:  Never Used  Substance Use Topics  . Alcohol use: No  . Drug use: No    Review of Systems  Constitutional: No fever/chills Eyes: No visual changes. ENT: No sore throat. Respiratory: Denies cough Genitourinary: Negative for dysuria. Musculoskeletal: Negative for back pain.  Positive for right ankle pain Skin: Negative for rash. Psychiatric: no mood changes,     ____________________________________________   PHYSICAL EXAM:  VITAL SIGNS: ED Triage Vitals  Enc Vitals Group     BP 01/26/20 2042 130/83     Pulse Rate 01/26/20 2042 72     Resp 01/26/20 2042 16     Temp 01/26/20 2042 99 F (37.2 C)     Temp Source 01/26/20 2042 Oral     SpO2 01/26/20 2042 100 %     Weight 01/26/20 2041 150 lb (68 kg)     Height 01/26/20 2041 5\' 7"  (1.702 m)     Head Circumference --      Peak Flow --      Pain Score 01/26/20 2040 5     Pain Loc --      Pain Edu? --      Excl. in [place]? --     Constitutional: Alert and oriented. Well appearing and in no acute distress. Eyes: Conjunctivae are normal.  Head: Atraumatic. Nose: No congestion/rhinnorhea. Mouth/Throat: Mucous membranes are moist.   Neck:  supple no lymphadenopathy noted Cardiovascular:  Normal rate, regular rhythm.  Respiratory: Normal respiratory effort.  No retractions GU: deferred Musculoskeletal: FROM all extremities, warm and well perfused, right ankle is tender at the lateral malleolus, fifth metatarsal is tender especially and distally, neurovascular is intact Neurologic:  Normal speech and language.  Skin:  Skin is warm, dry and intact. No rash noted. Psychiatric: Mood and affect are normal. Speech and behavior are normal.  ____________________________________________   LABS (all labs ordered are listed, but only abnormal results are displayed)  Labs Reviewed - No data to display ____________________________________________   ____________________________________________  RADIOLOGY  X-ray of the right ankle  is negative X-ray of the right foot is negative for fracture  ____________________________________________   PROCEDURES  Procedure(s) performed: Ace wrap applied by nursing staff   Procedures    ____________________________________________   INITIAL IMPRESSION / ASSESSMENT AND PLAN / ED COURSE  Pertinent labs & imaging results that were available during my care of the patient were reviewed by me and considered in my medical decision making (see chart for details).   Patient is a 29 year old female presents emergency department for right ankle injury.  See HPI.  Physical exam is consistent with the right ankle injury.  Right ankle is tender along the lateral malleolus and right fifth metatarsal.  Neurovascular is intact.  X-ray of the right ankle is negative X-ray of the right foot is negative for fracture  Explained the findings to the patient.  She was placed on Ace wrap.  She is to take Tylenol and ibuprofen for pain if needed.  Elevate and ice the foot.  If not better in 1 week follow-up with orthopedics.  She states she understands.  She was discharged stable condition.    [name] was evaluated in Emergency Department on 01/26/2020 for the symptoms described in the history of present illness. She was evaluated in the context of the global COVID-19 pandemic, which necessitated consideration that the patient might be at risk for infection with the SARS-CoV-2 virus that causes COVID-19. Institutional protocols and algorithms that pertain to the evaluation of patients at risk for COVID-19 are in a state of rapid change based on information released by regulatory bodies including the CDC and federal and state organizations. These policies and algorithms were followed during the patient's care in the ED.   As part of my medical decision making, I reviewed the following data within the electronic MEDICAL RECORD NUMBER Nursing notes reviewed and incorporated, Old chart reviewed,  Radiograph reviewed , Notes from prior ED visits and [place] Controlled Substance Database  ____________________________________________   FINAL CLINICAL IMPRESSION(S) / ED DIAGNOSES  Final diagnoses:  Sprain of right ankle, unspecified ligament, initial encounter      NEW MEDICATIONS STARTED DURING THIS VISIT:  Current Discharge Medication List       Note:  This document was prepared using Dragon voice recognition software and may include unintentional dictation errors.    Faythe Ghee, PA-C 01/26/20 2254    Emily Filbert, MD 01/26/20 2322

## 2020-01-26 NOTE — Discharge Instructions (Addendum)
Elevate and ice the ankle.  Take Tylenol or ibuprofen for pain as needed.  Wear the Ace wrap to help support the ankle.  Return to the emergency department or follow-up with orthopedics if worsening.

## 2020-01-26 NOTE — ED Notes (Signed)
Left ankle wrapped in ace bandage, CMS intact. Pt verbalizes greater comfort.

## 2020-01-26 NOTE — ED Notes (Signed)
Pt states she stepped on a stair wrong and ankle bent to side. Pt c/o right ankle pain, no swelling or deformity noted.

## 2021-07-20 ENCOUNTER — Other Ambulatory Visit: Payer: Self-pay

## 2021-07-20 ENCOUNTER — Encounter: Payer: Self-pay | Admitting: Emergency Medicine

## 2021-07-20 DIAGNOSIS — M79605 Pain in left leg: Secondary | ICD-10-CM | POA: Insufficient documentation

## 2021-07-20 DIAGNOSIS — Y92410 Unspecified street and highway as the place of occurrence of the external cause: Secondary | ICD-10-CM | POA: Insufficient documentation

## 2021-07-20 DIAGNOSIS — Z5321 Procedure and treatment not carried out due to patient leaving prior to being seen by health care provider: Secondary | ICD-10-CM | POA: Insufficient documentation

## 2021-07-20 DIAGNOSIS — M25512 Pain in left shoulder: Secondary | ICD-10-CM | POA: Insufficient documentation
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
## 2021-07-21 ENCOUNTER — Emergency Department
Admission: EM | Admit: 2021-07-21 | Discharge: 2021-07-21 | Disposition: A | Discharge status: Home / Self Care | Attending: Emergency Medicine | Admitting: Emergency Medicine
